# Patient Record
Sex: Male | Born: 2000 | Race: White | Hispanic: Yes | Marital: Single
Health system: Southern US, Community
[De-identification: ages and names within clinical notes are randomized; demographics above are authoritative.]

---

## 2020-09-16 ENCOUNTER — Emergency Department (HOSPITAL_COMMUNITY): Payer: Self-pay

## 2020-09-16 ENCOUNTER — Other Ambulatory Visit: Payer: Self-pay

## 2020-09-16 ENCOUNTER — Encounter (HOSPITAL_COMMUNITY): Payer: Self-pay | Admitting: Emergency Medicine

## 2020-09-16 ENCOUNTER — Emergency Department (HOSPITAL_COMMUNITY)
Admission: EM | Admit: 2020-09-16 | Discharge: 2020-09-16 | Disposition: A | Discharge status: Home / Self Care | Payer: Self-pay | Attending: Emergency Medicine | Admitting: Emergency Medicine

## 2020-09-16 DIAGNOSIS — R509 Fever, unspecified: Secondary | ICD-10-CM | POA: Insufficient documentation

## 2020-09-16 DIAGNOSIS — M5126 Other intervertebral disc displacement, lumbar region: Secondary | ICD-10-CM

## 2020-09-16 DIAGNOSIS — M5136 Other intervertebral disc degeneration, lumbar region: Secondary | ICD-10-CM

## 2020-09-16 DIAGNOSIS — M25559 Pain in unspecified hip: Secondary | ICD-10-CM | POA: Insufficient documentation

## 2020-09-16 DIAGNOSIS — M5416 Radiculopathy, lumbar region: Secondary | ICD-10-CM | POA: Insufficient documentation

## 2020-09-16 LAB — URINALYSIS, ROUTINE W REFLEX MICROSCOPIC
Bilirubin Urine: NEGATIVE
Glucose, UA: NEGATIVE mg/dL
Hgb urine dipstick: NEGATIVE
Ketones, ur: NEGATIVE mg/dL
Leukocytes,Ua: NEGATIVE
Nitrite: NEGATIVE
Protein, ur: NEGATIVE mg/dL
Specific Gravity, Urine: 1.025 (ref 1.005–1.030)
pH: 5 (ref 5.0–8.0)

## 2020-09-16 LAB — CBC WITH DIFFERENTIAL/PLATELET
Abs Immature Granulocytes: 0.01 10*3/uL (ref 0.00–0.07)
Basophils Absolute: 0 10*3/uL (ref 0.0–0.1)
Basophils Relative: 1 %
Eosinophils Absolute: 0 10*3/uL (ref 0.0–0.5)
Eosinophils Relative: 1 %
HCT: 48.1 % (ref 39.0–52.0)
Hemoglobin: 15.3 g/dL (ref 13.0–17.0)
Immature Granulocytes: 0 %
Lymphocytes Relative: 33 %
Lymphs Abs: 1.4 10*3/uL (ref 0.7–4.0)
MCH: 27.5 pg (ref 26.0–34.0)
MCHC: 31.8 g/dL (ref 30.0–36.0)
MCV: 86.4 fL (ref 80.0–100.0)
Monocytes Absolute: 0.8 10*3/uL (ref 0.1–1.0)
Monocytes Relative: 20 %
Neutro Abs: 1.9 10*3/uL (ref 1.7–7.7)
Neutrophils Relative %: 45 %
Platelets: 187 10*3/uL (ref 150–400)
RBC: 5.57 MIL/uL (ref 4.22–5.81)
RDW: 13.2 % (ref 11.5–15.5)
WBC: 4.2 10*3/uL (ref 4.0–10.5)
nRBC: 0 % (ref 0.0–0.2)

## 2020-09-16 LAB — COMPREHENSIVE METABOLIC PANEL
ALT: 40 U/L (ref 0–44)
AST: 35 U/L (ref 15–41)
Albumin: 4.5 g/dL (ref 3.5–5.0)
Alkaline Phosphatase: 100 U/L (ref 38–126)
Anion gap: 11 (ref 5–15)
BUN: 12 mg/dL (ref 6–20)
CO2: 25 mmol/L (ref 22–32)
Calcium: 9.3 mg/dL (ref 8.9–10.3)
Chloride: 99 mmol/L (ref 98–111)
Creatinine, Ser: 0.95 mg/dL (ref 0.61–1.24)
GFR, Estimated: >60 mL/min (ref >60)
Glucose, Bld: 110 mg/dL — ABNORMAL HIGH (ref 70–99)
Potassium: 3.5 mmol/L (ref 3.5–5.1)
Sodium: 135 mmol/L (ref 135–145)
Total Bilirubin: 0.6 mg/dL (ref 0.3–1.2)
Total Protein: 7.6 g/dL (ref 6.5–8.1)

## 2020-09-16 LAB — SEDIMENTATION RATE: Sed Rate: 7 mm/hr (ref 0–16)

## 2020-09-16 LAB — C-REACTIVE PROTEIN: CRP: 2.5 mg/dL — ABNORMAL HIGH (ref <1.0)

## 2020-09-16 IMAGING — MR MR THORACIC SPINE WO/W CM
5 of 11 series · 17 of 48 positions shown · IV contrast (gadavist)
Comparison: None.

CLINICAL DATA: Initial evaluation for epidural abscess suspected.
Acute right-sided lower back pain.

EXAM:
MRI THORACIC AND LUMBAR SPINE WITHOUT AND WITH CONTRAST
TECHNIQUE: Multiplanar and multiecho pulse sequences of the thoracic and lumbar
spine were obtained without and with intravenous contrast.
CONTRAST:  7.5mL GADAVIST GADOBUTROL 1 MMOL/ML IV SOLN

[Series 16: T1 · sagittal · 3.0mm · 0.62mm/px · 3 of 13 slices shown (1 of 3)]
[im 1/13]
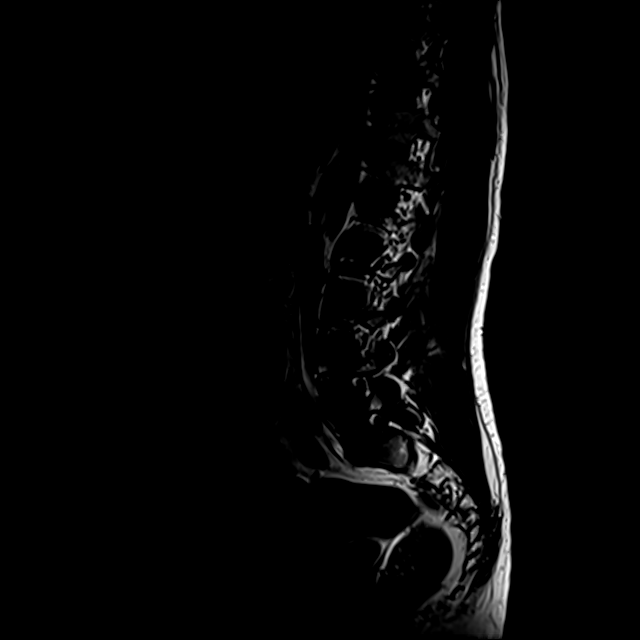
[im 7/13]
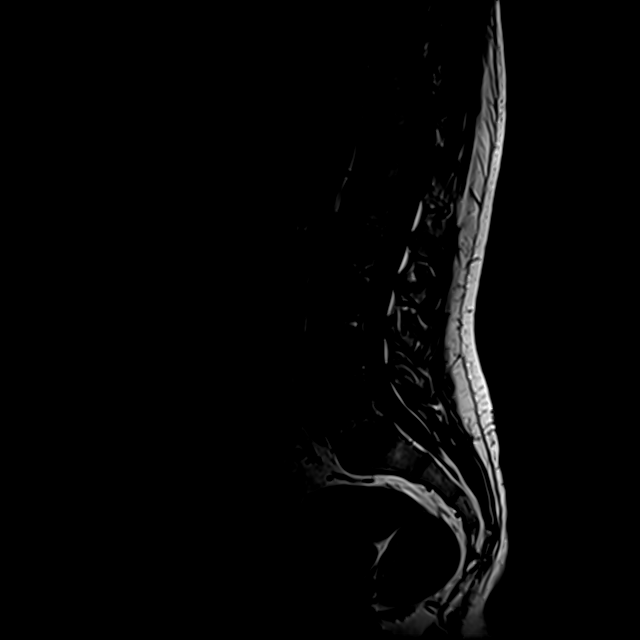
[im 13/13]
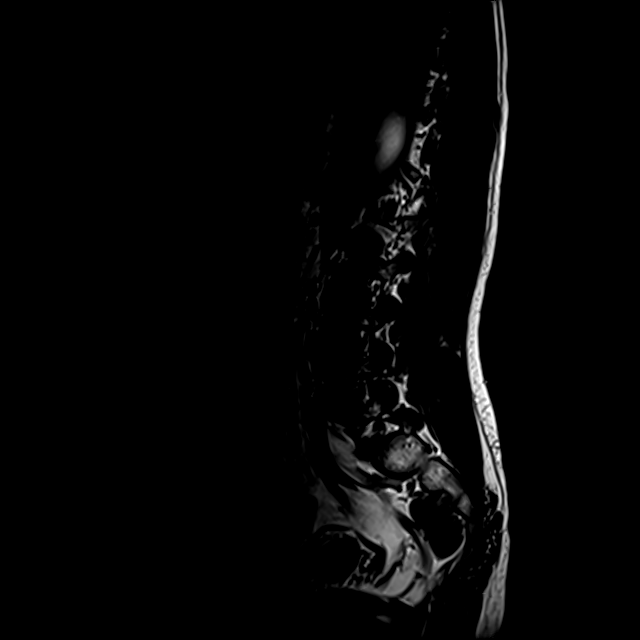

[Series 17: T1 · sagittal · 3.0mm · 0.62mm/px · 3 of 13 slices shown (2 of 3)]
[im 1/13]
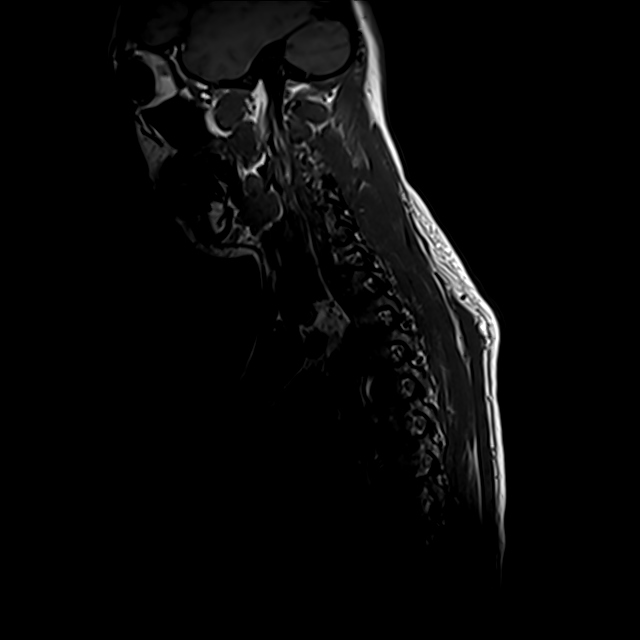
[im 7/13]
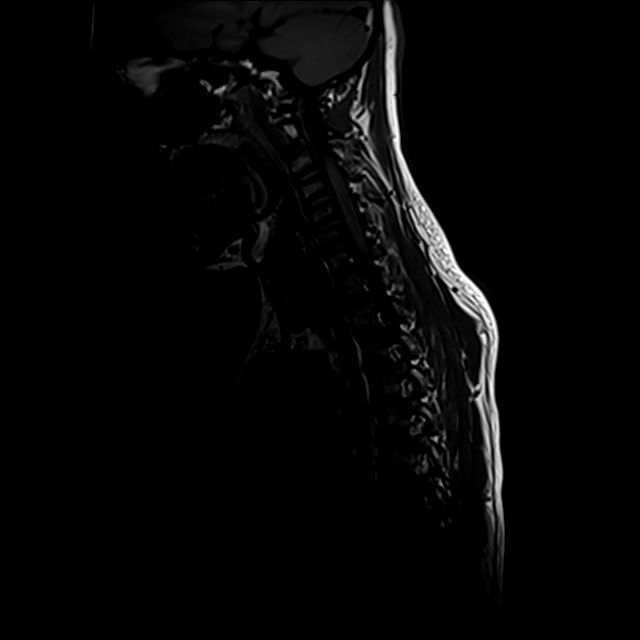
[im 13/13]
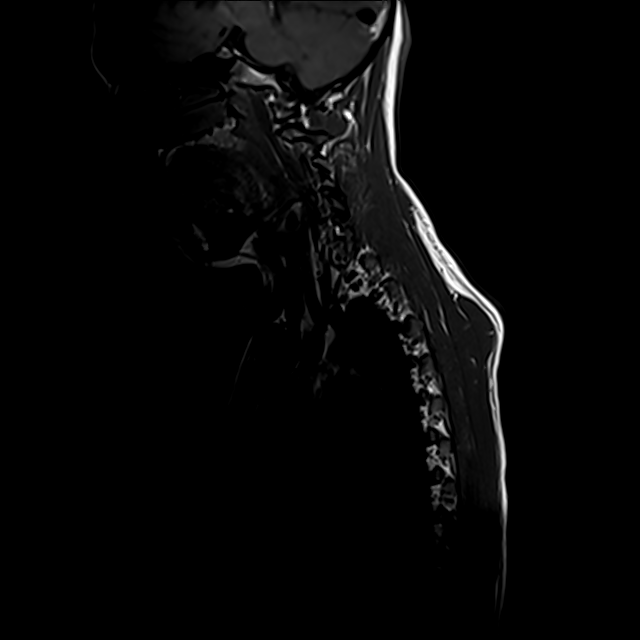

[Series 18: T1 · sagittal · 3.3mm · 0.62mm/px · 1 of 12 slices shown (3 of 3)]
[im 1/12]
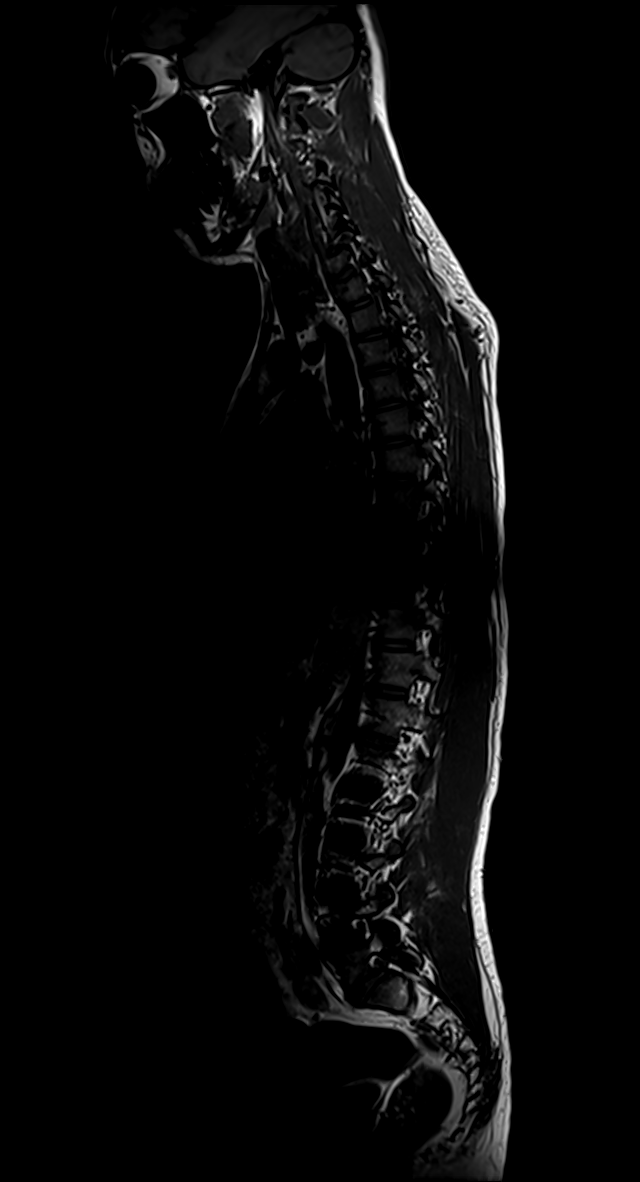

[Series 19: T2 · sagittal · 3.0mm · 0.67mm/px · 3 of 17 slices shown (1 of 2)]
[im 1/17]
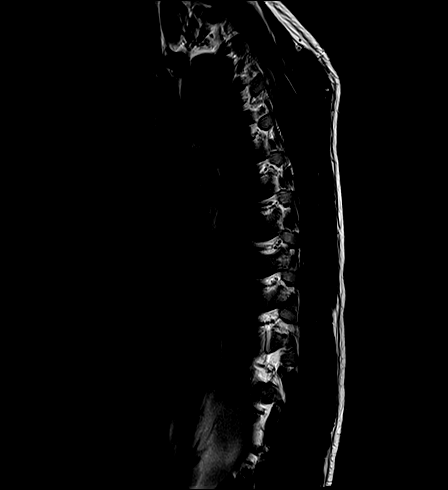
[im 9/17]
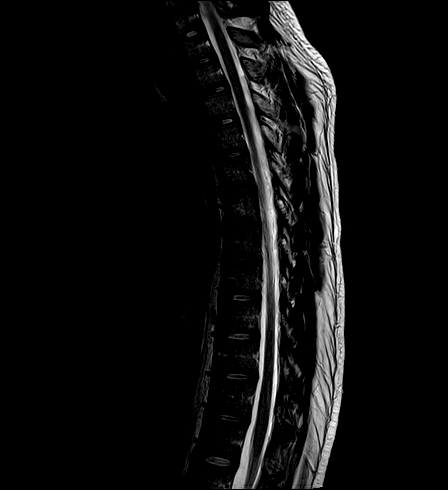
[im 17/17]
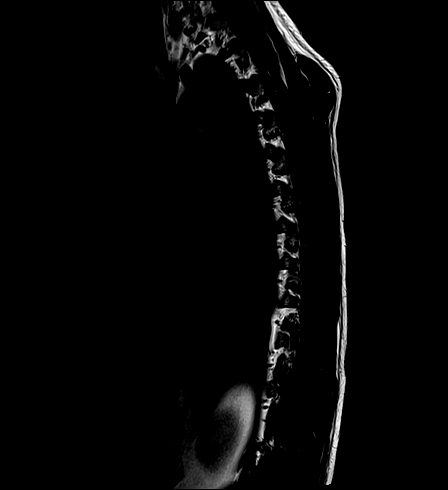

[Series 22: T2 · axial · 5.0mm · 0.59mm/px · z∈[-325,-41]mm · 7 of 39 slices shown (2 of 2)]
[im 1/39]
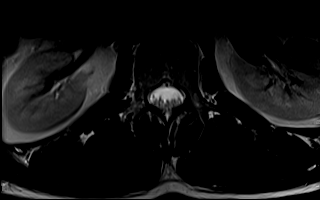
[im 7/39]
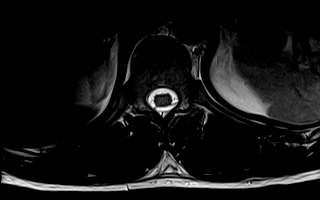
[im 13/39]
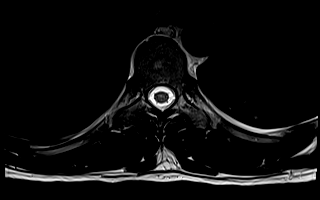
[im 20/39]
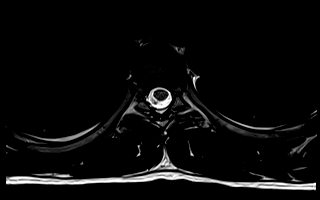
[im 26/39]
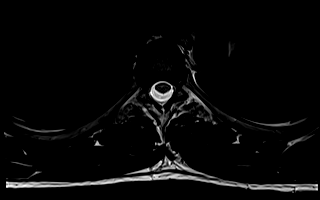
[im 32/39]
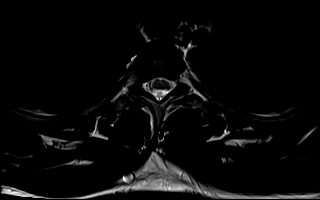
[im 39/39]
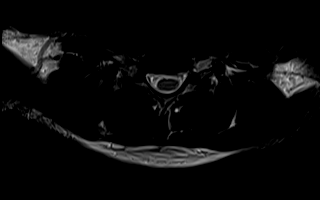

[17 of 48 positions shown; findings below may reference images not displayed]

FINDINGS: MRI THORACIC SPINE FINDINGS

Alignment:  Physiologic.  No listhesis.

Vertebrae: Vertebral body height maintained without acute or chronic
fracture. Bone marrow signal intensity normal. No discrete or
worrisome osseous lesions. No abnormal marrow edema or enhancement.
No evidence for osteomyelitis discitis or septic arthritis.

Cord: Normal signal and morphology. No epidural abscess or other
collection. No abnormal enhancement.

Paraspinal and other soft tissues: Unremarkable.

Disc levels:

No significant disc pathology seen within the thoracic spine.
Intervertebral disc space height maintained. No disc bulge or focal
disc herniation. No stenosis or neural impingement.

MRI LUMBAR SPINE FINDINGS

Segmentation:  Standard.

Alignment: Chronic bilateral pars defects at L5 with associated 4 mm
spondylolisthesis of L5 on S1. Alignment otherwise normal.

Vertebrae: Vertebral body height maintained without acute or chronic
fracture. Bone marrow signal intensity within normal limits. No
discrete or worrisome osseous lesions. No abnormal marrow edema or
enhancement. No evidence for osteomyelitis discitis or septic
arthritis. Visualized SI joints within normal limits.

Conus medullaris: Extends to the L1 level and appears normal. No
epidural abscess or other collection.

Paraspinal and other soft tissues: Unremarkable.

Disc levels:

No significant findings are seen through the L3-4 level.

L4-5: Mild disc bulge. Tiny central disc protrusion mildly indents
the ventral epidural fat. No spinal stenosis or neural impingement.
Mild bilateral L4 foraminal stenosis.

L5-S1: Chronic bilateral pars defects with associated 4 mm
spondylolisthesis. Associated broad posterior pseudo disc
bulge/uncovering. Mild facet hypertrophy. No canal or lateral recess
stenosis. Mild bilateral L5 foraminal narrowing.
IMPRESSION: 1. No evidence for osteomyelitis discitis or septic arthritis within
the thoracic or lumbar spine. No epidural abscess or other
collection.
2. Chronic bilateral pars defects at L5 with associated 4 mm
spondylolisthesis. Associated mild bilateral L5 foraminal stenosis.
3. Mild disc bulge with tiny central disc protrusion at L4-5 without
significant stenosis or impingement.

## 2020-09-16 IMAGING — DX DG HIP (WITH OR WITHOUT PELVIS) 2-3V*R*
3 series · 3 of 3 positions shown · non-contrast
Comparison: None

CLINICAL DATA: RIGHT hip pain

EXAM:
DG HIP (WITH OR WITHOUT PELVIS) 2-3V RIGHT

[pelvis ap]
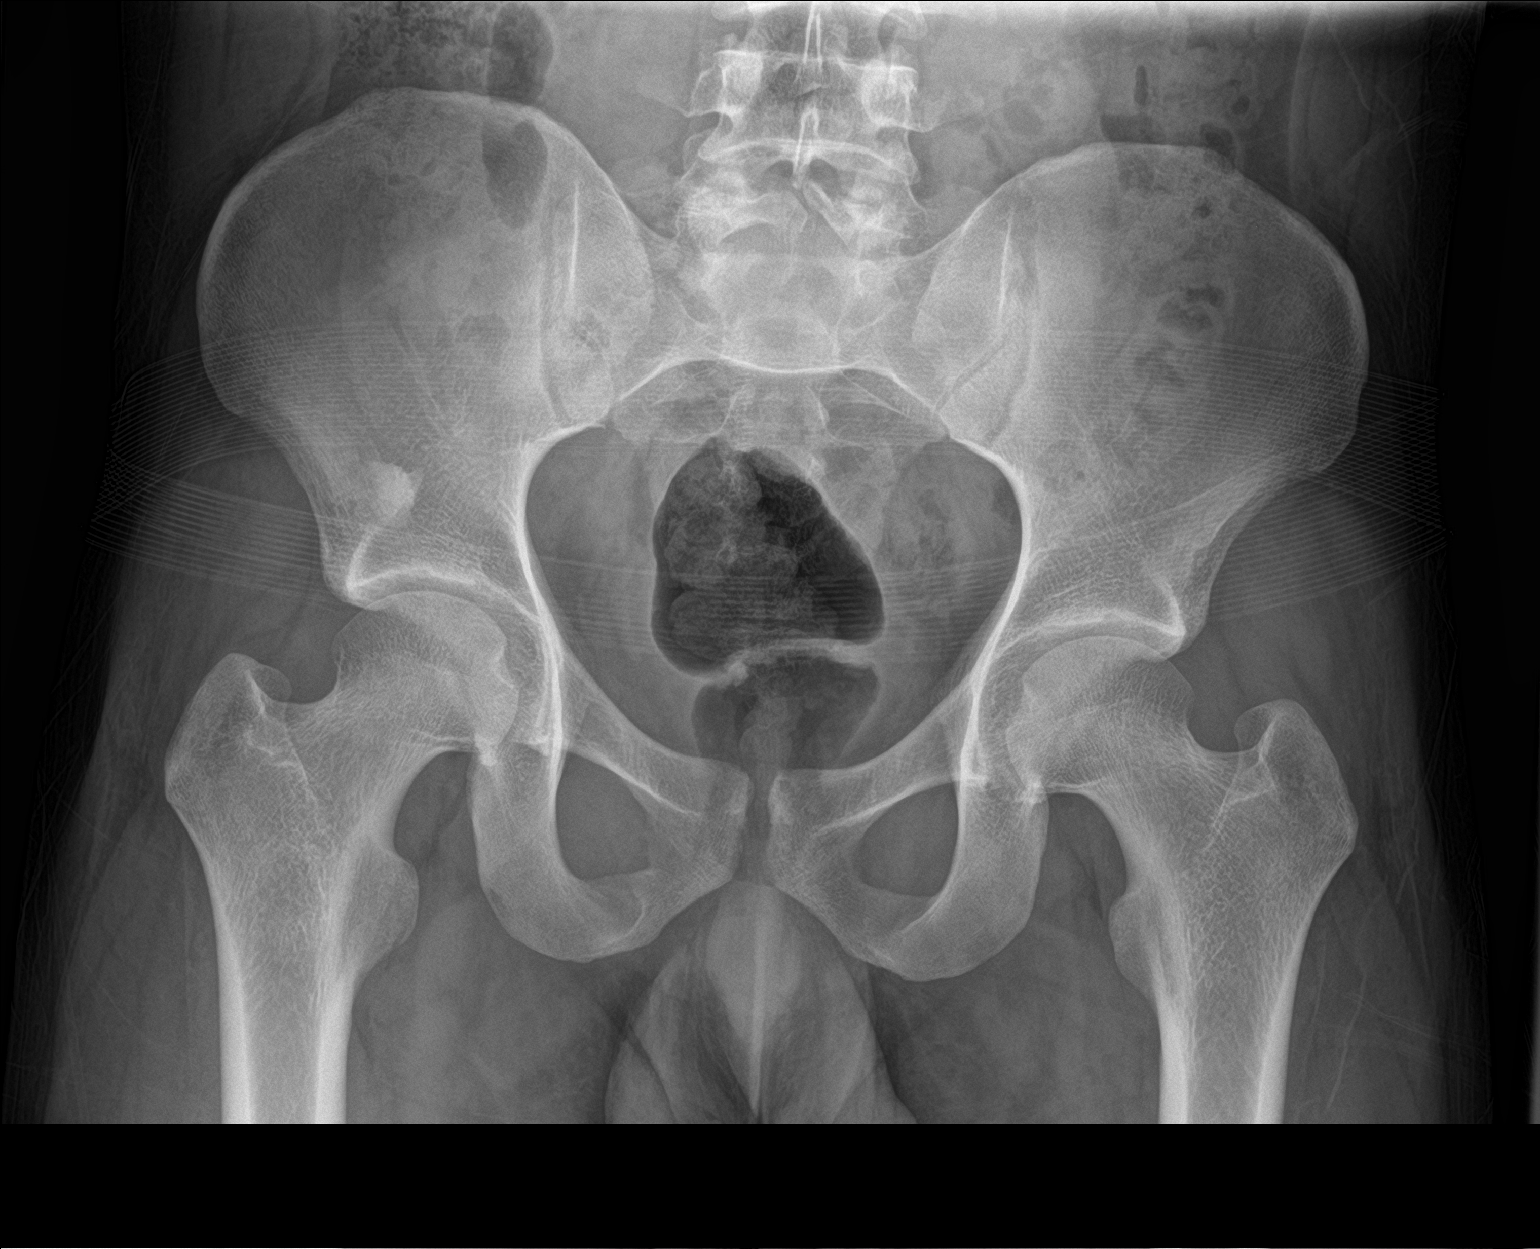

[hip ap]
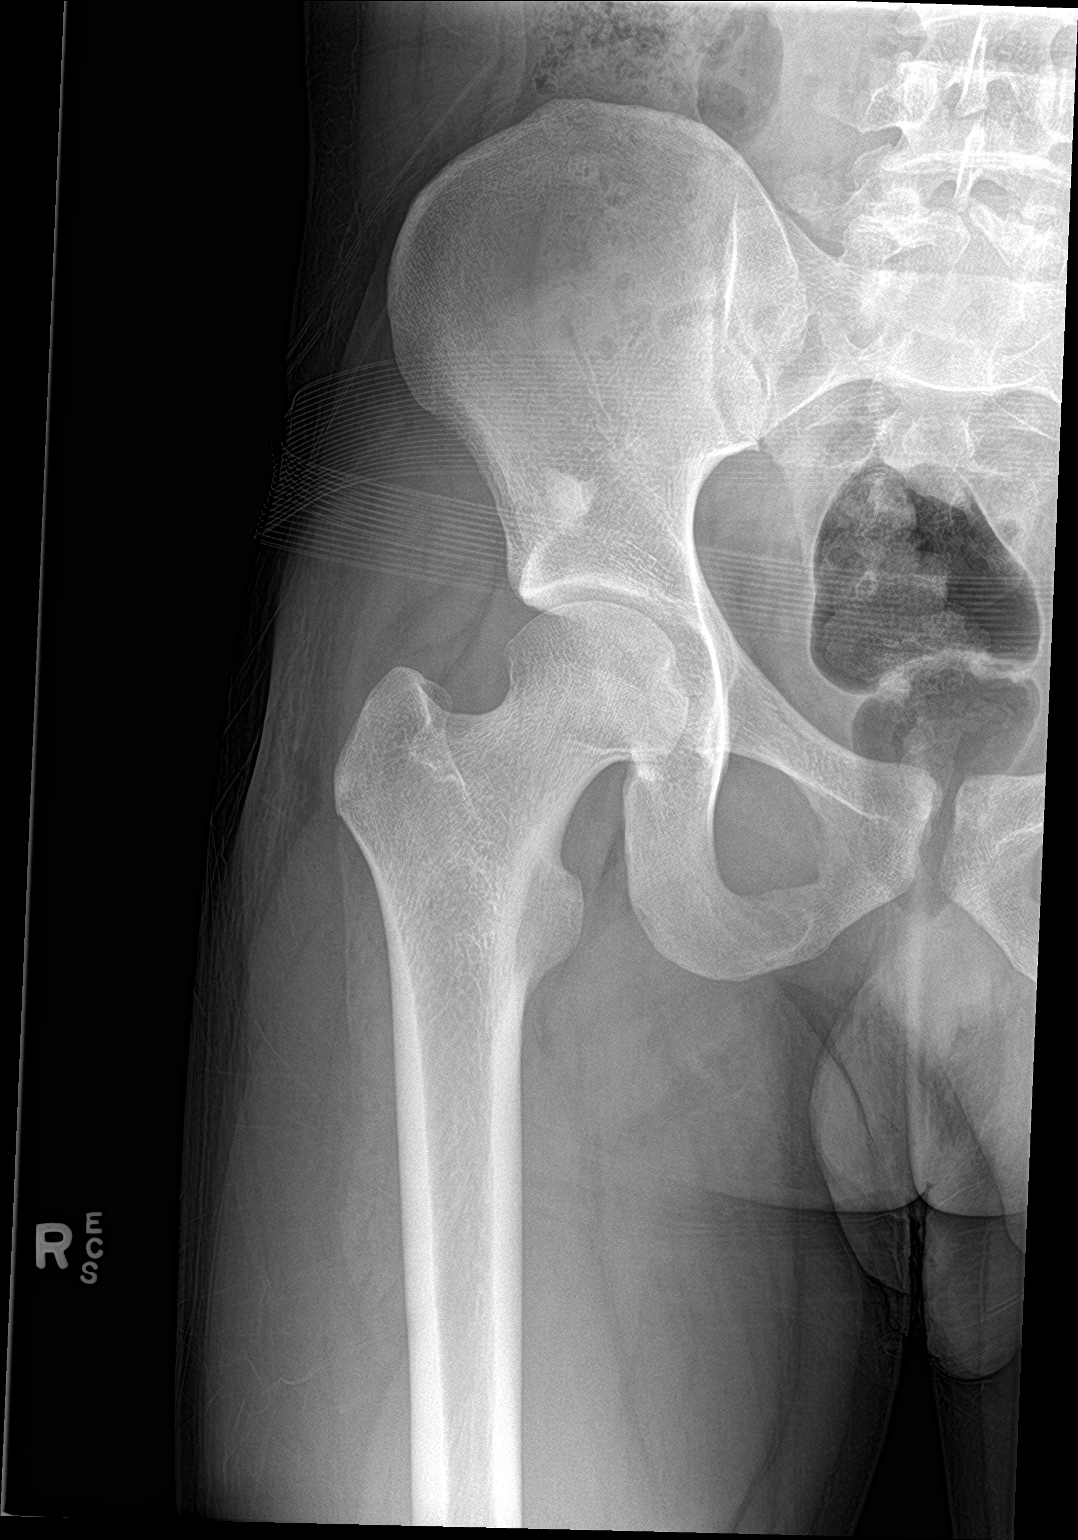

[hip lat]
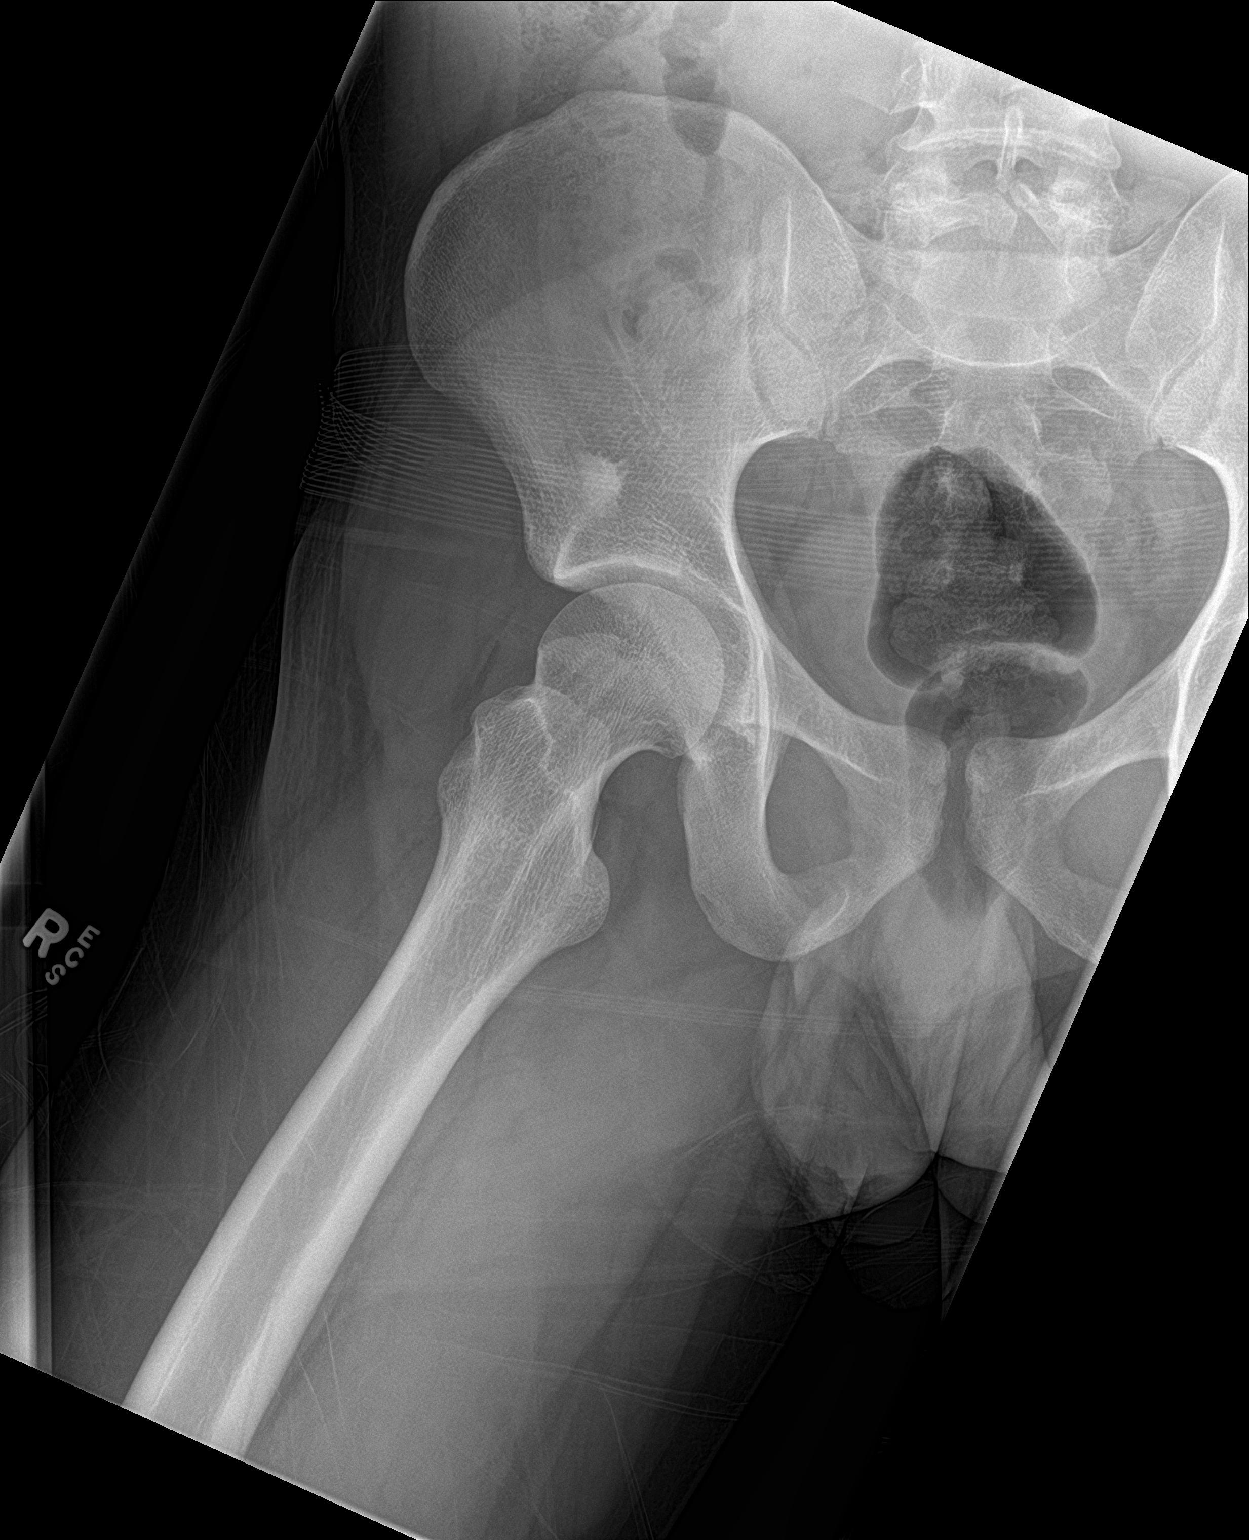

[3 of 3 positions shown; findings below may reference images not displayed]

FINDINGS: There is no evidence of hip fracture or dislocation. There is no
evidence of arthropathy or other focal bone abnormality. Question
mild subcutaneous fat stranding slightly asymmetric to the LEFT
though this is un clear.
IMPRESSION: 1. No acute osseous abnormality.
2. Question mild subcutaneous fat stranding, slightly asymmetric to
the LEFT.

## 2020-09-16 IMAGING — MR MR LUMBAR SPINE WO/W CM
6 of 9 series · 29 of 48 positions shown · IV contrast (gadavist)
Comparison: None.

CLINICAL DATA: Initial evaluation for epidural abscess suspected.
Acute right-sided lower back pain.

EXAM:
MRI THORACIC AND LUMBAR SPINE WITHOUT AND WITH CONTRAST
TECHNIQUE: Multiplanar and multiecho pulse sequences of the thoracic and lumbar
spine were obtained without and with intravenous contrast.
CONTRAST:  7.5mL GADAVIST GADOBUTROL 1 MMOL/ML IV SOLN

[Series 1: T2 · sagittal · 4.0mm · 0.62mm/px · 4 of 16 slices shown (1 of 2)]
[im 1/16]
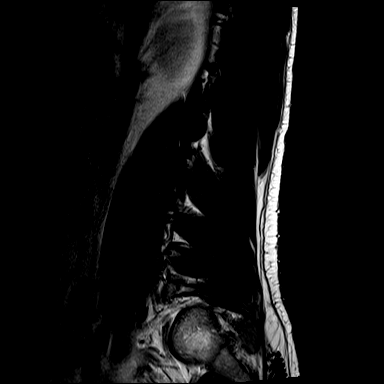
[im 6/16]
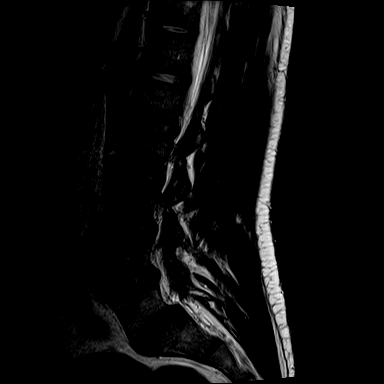
[im 11/16]
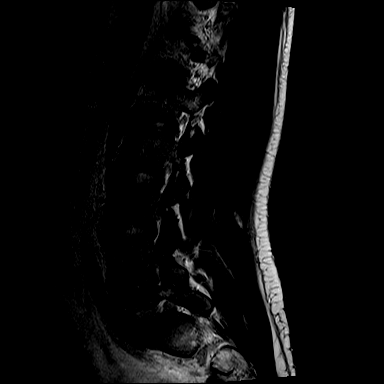
[im 16/16]
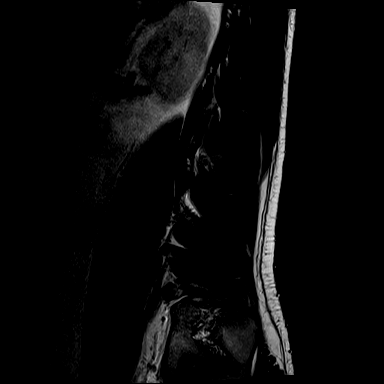

[Series 3: T1 · sagittal · 4.0mm · 0.75mm/px · 3 of 16 slices shown (1 of 2)]
[im 1/16]
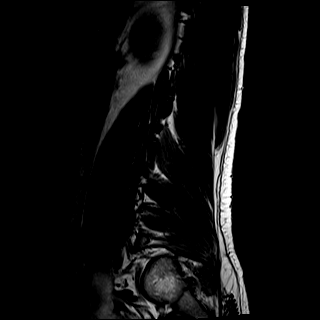
[im 8/16]
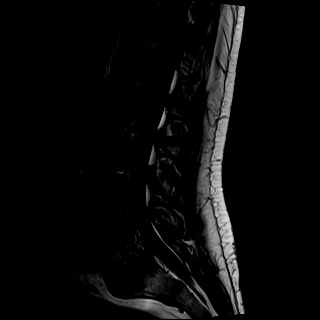
[im 16/16]
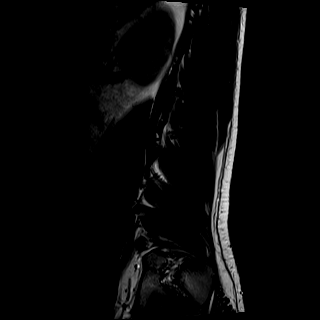

[Series 4: T2 · axial · 4.0mm · 0.57mm/px · z∈[-516,-312]mm · 7 of 38 slices shown (2 of 2)]
[im 1/38]
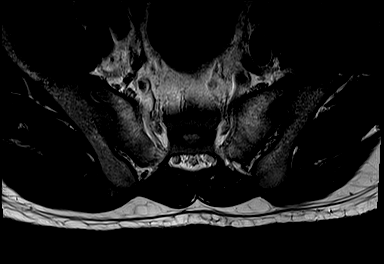
[im 7/38]
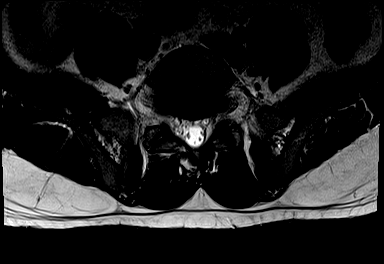
[im 13/38]
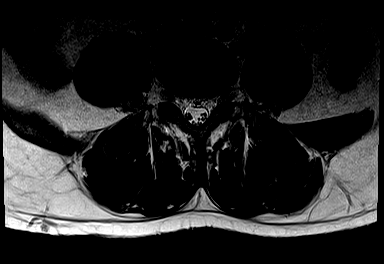
[im 19/38]
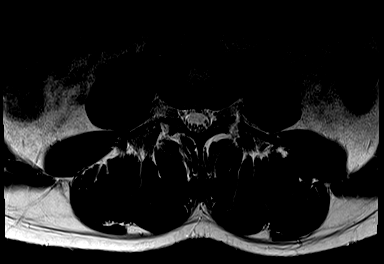
[im 25/38]
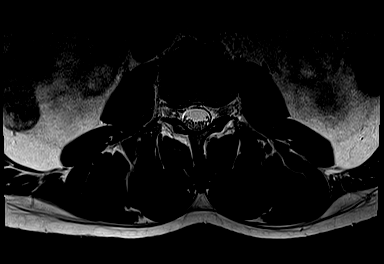
[im 31/38]
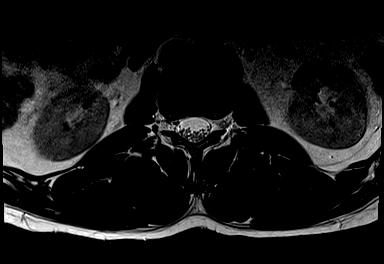
[im 38/38]
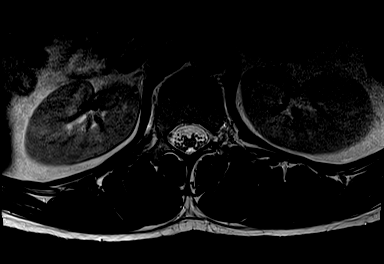

[Series 6: T1 · axial · 4.0mm · 0.34mm/px · z∈[-516,-312]mm · 7 of 38 slices shown (2 of 2)]
[im 1/38]
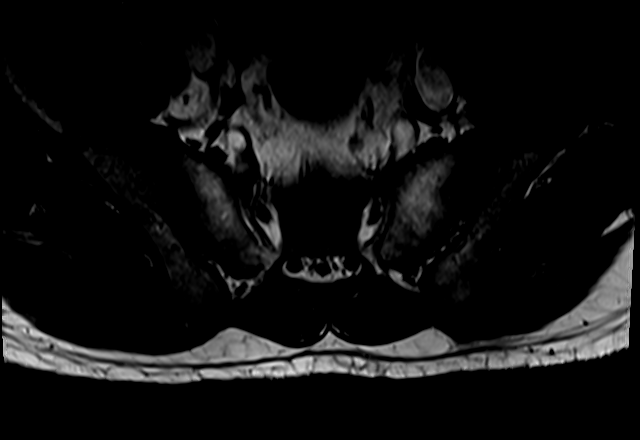
[im 7/38]
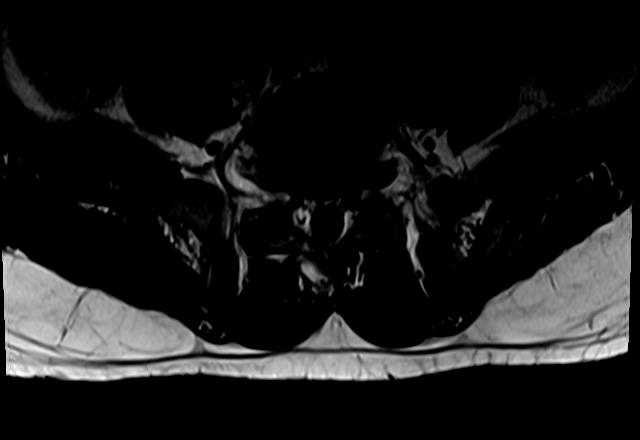
[im 13/38]
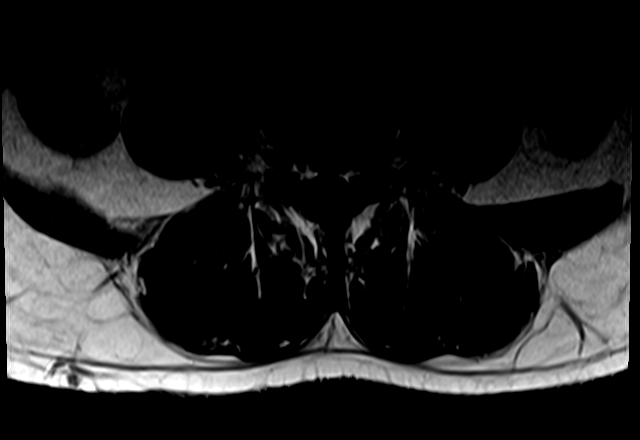
[im 19/38]
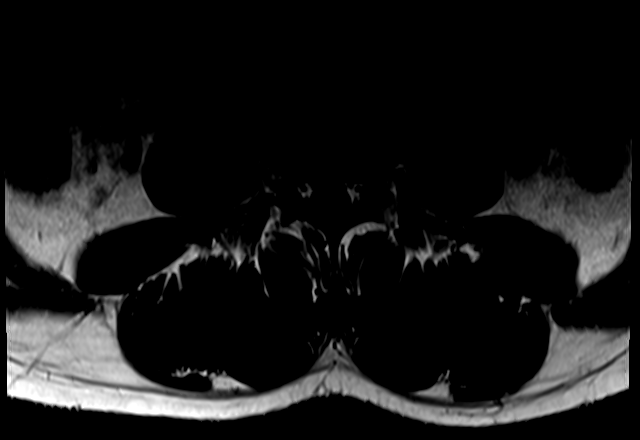
[im 25/38]
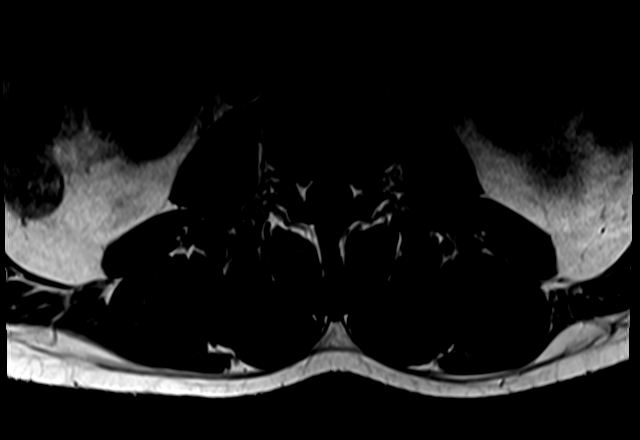
[im 31/38]
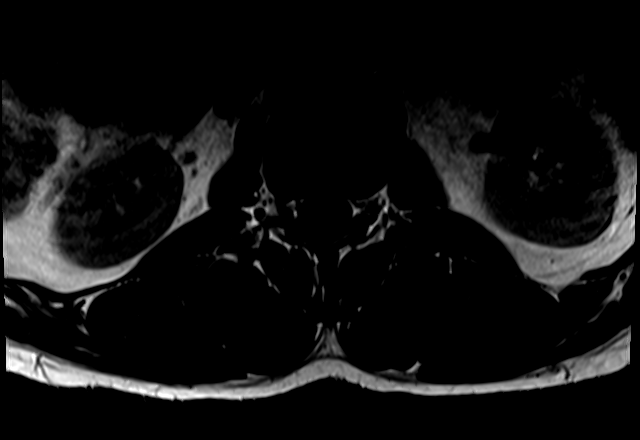
[im 38/38]
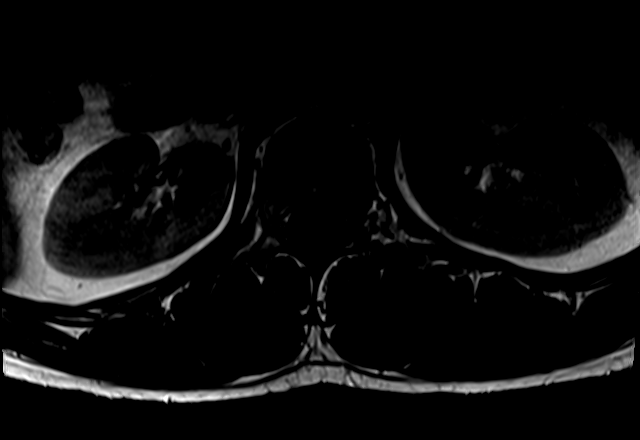

[Series 23: T1 fat-sat post-contrast · sagittal · 4.0mm · 0.75mm/px · 3 of 16 slices shown (1 of 2)]
[im 1/16]
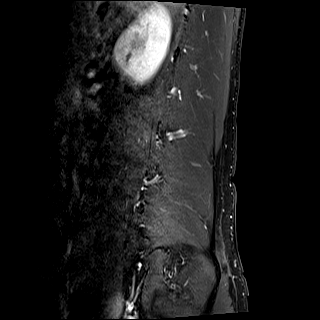
[im 8/16]
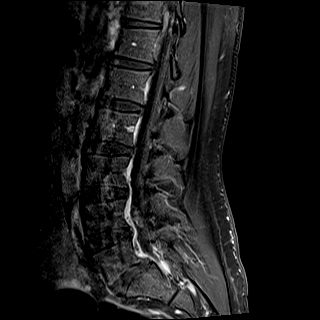
[im 16/16]
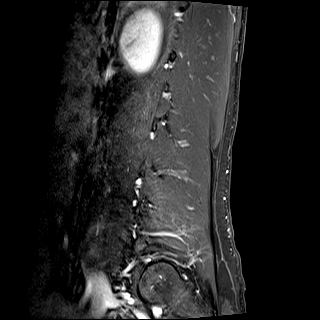

[Series 25: T1 fat-sat post-contrast · axial · 4.0mm · 0.43mm/px · z∈[-506,-365]mm · 5 of 38 slices shown (2 of 2)]
[im 1/38]
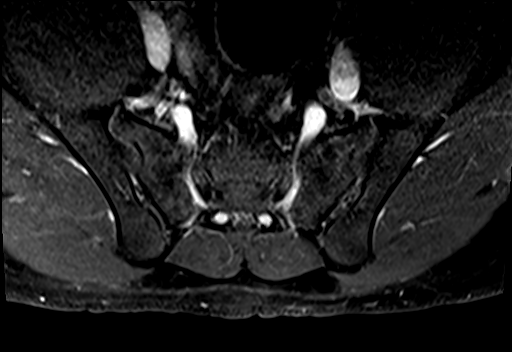
[im 7/38]
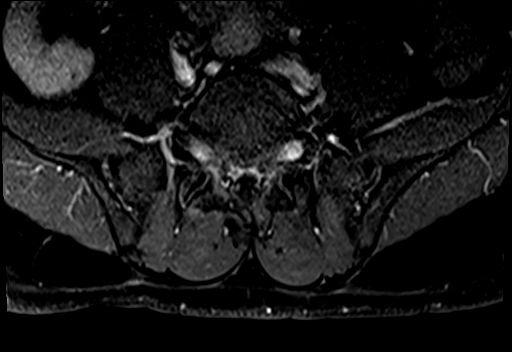
[im 13/38]
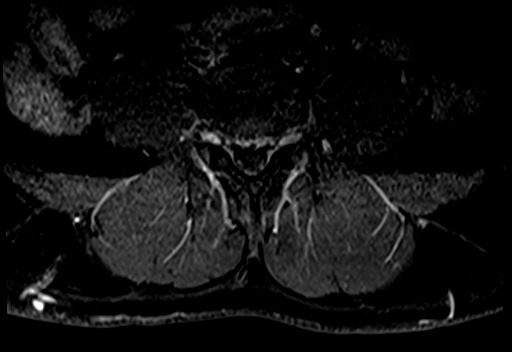
[im 19/38]
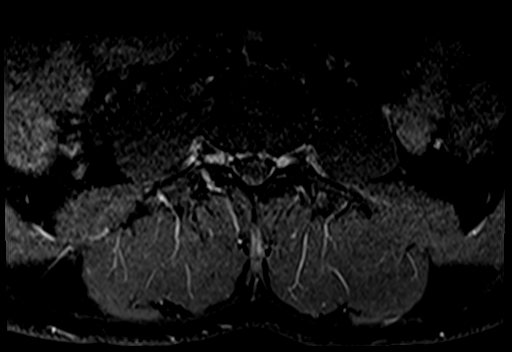
[im 25/38]
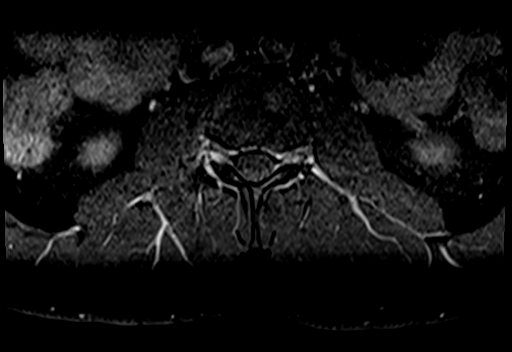

[29 of 48 positions shown; findings below may reference images not displayed]

FINDINGS: MRI THORACIC SPINE FINDINGS

Alignment:  Physiologic.  No listhesis.

Vertebrae: Vertebral body height maintained without acute or chronic
fracture. Bone marrow signal intensity normal. No discrete or
worrisome osseous lesions. No abnormal marrow edema or enhancement.
No evidence for osteomyelitis discitis or septic arthritis.

Cord: Normal signal and morphology. No epidural abscess or other
collection. No abnormal enhancement.

Paraspinal and other soft tissues: Unremarkable.

Disc levels:

No significant disc pathology seen within the thoracic spine.
Intervertebral disc space height maintained. No disc bulge or focal
disc herniation. No stenosis or neural impingement.

MRI LUMBAR SPINE FINDINGS

Segmentation:  Standard.

Alignment: Chronic bilateral pars defects at L5 with associated 4 mm
spondylolisthesis of L5 on S1. Alignment otherwise normal.

Vertebrae: Vertebral body height maintained without acute or chronic
fracture. Bone marrow signal intensity within normal limits. No
discrete or worrisome osseous lesions. No abnormal marrow edema or
enhancement. No evidence for osteomyelitis discitis or septic
arthritis. Visualized SI joints within normal limits.

Conus medullaris: Extends to the L1 level and appears normal. No
epidural abscess or other collection.

Paraspinal and other soft tissues: Unremarkable.

Disc levels:

No significant findings are seen through the L3-4 level.

L4-5: Mild disc bulge. Tiny central disc protrusion mildly indents
the ventral epidural fat. No spinal stenosis or neural impingement.
Mild bilateral L4 foraminal stenosis.

L5-S1: Chronic bilateral pars defects with associated 4 mm
spondylolisthesis. Associated broad posterior pseudo disc
bulge/uncovering. Mild facet hypertrophy. No canal or lateral recess
stenosis. Mild bilateral L5 foraminal narrowing.
IMPRESSION: 1. No evidence for osteomyelitis discitis or septic arthritis within
the thoracic or lumbar spine. No epidural abscess or other
collection.
2. Chronic bilateral pars defects at L5 with associated 4 mm
spondylolisthesis. Associated mild bilateral L5 foraminal stenosis.
3. Mild disc bulge with tiny central disc protrusion at L4-5 without
significant stenosis or impingement.

## 2020-09-16 MED ORDER — CYCLOBENZAPRINE HCL 10 MG PO TABS: 10.0000 mg | ORAL_TABLET | Freq: Two times a day (BID) | ORAL | 0 refills | Status: AC | PRN

## 2020-09-16 MED ORDER — IBUPROFEN 600 MG PO TABS: 600.0000 mg | ORAL_TABLET | Freq: Four times a day (QID) | ORAL | 0 refills | Status: AC | PRN

## 2020-09-16 MED ORDER — ACETAMINOPHEN 500 MG PO TABS
1000.0000 mg | ORAL_TABLET | Freq: Once | ORAL | Status: AC
  Administered 2020-09-16: 1000 mg via ORAL
  Filled 2020-09-16: qty 2

## 2020-09-16 MED ORDER — GADOBUTROL 1 MMOL/ML IV SOLN
7.5000 mL | Freq: Once | INTRAVENOUS | Status: AC | PRN
  Administered 2020-09-16: 7.5 mL via INTRAVENOUS

## 2020-09-16 NOTE — ED Triage Notes (Signed)
Pt coming from home. Pt complaint of hip pain and fever that began yesterday. VSS. NAD.

## 2020-09-16 NOTE — Progress Notes (Signed)
Pt vomited immediately following MRI contrast (Gadavist). Nausea and vomiting passed in 25 to 30 seconds, pt returned back to baseline and finished study.

## 2020-09-16 NOTE — Discharge Instructions (Signed)
Puede tomar 600 mg de ibuprofeno cada 6 horas segn sea necesario para el dolor. Aplique hielo en su espalda durante 20 minutos a la vez. Tambin puede aplicar calor si esto le proporciona ms alivio. Puede tomar Flexeril / ciclobenzaprina cada 12 horas segn sea necesario para el espasmo muscular. Tenga en cuenta que este medicamento puede causarle somnolencia; no lo tome mientras conduce o bebe alcohol. Establezca la atencin con un proveedor de [employer] scientist. Puede programar una cita con el ortopedista segn sea necesario si los sntomas persisten. Regrese a la sala de emergencias si tiene un nuevo entumecimiento o debilidad en las piernas, incapacidad para orinar, incapacidad para contener los intestinos o debilidad en las extremidades, fiebre persistente.

## 2020-09-16 NOTE — ED Provider Notes (Signed)
Care assumed at shift change from Dr. Adela Lank, pending MRI imaging of the spine and blood work. See their note for full HPI and workup. Briefly, pt presenting with 1 day of fever, right sided-low back pain radiating to right hip/groin. No urinary sx or abdominal complaints. No medical problems. Labs and MRI ordered - rule out epidural abscess. If neg likely musculoskeletal with FUO. Consider urinary tract cause if UA with abnl results.   Physical Exam  BP 106/74 (BP Location: Left Arm)   Pulse 95   Temp 99.2 F (37.3 C) (Oral)   Resp 17   Ht 5\' 6"  (1.676 m)   Wt 77.1 kg   SpO2 98%   BMI 27.44 kg/m   Physical Exam Vitals and nursing note reviewed.  Constitutional:      General: He is not in acute distress.    Appearance: He is well-developed.  HENT:     Head: Normocephalic and atraumatic.  Eyes:     Conjunctiva/sclera: Conjunctivae normal.  Cardiovascular:     Rate and Rhythm: Normal rate.  Pulmonary:     Effort: Pulmonary effort is normal.  Abdominal:     Palpations: Abdomen is soft.  Skin:    General: Skin is warm.  Neurological:     Mental Status: He is alert.  Psychiatric:        Behavior: Behavior normal.     ED Course/Procedures    Results for orders placed or performed during the hospital encounter of 09/16/20  CBC with Differential  Result Value Ref Range   WBC 4.2 4.0 - 10.5 K/uL   RBC 5.57 4.22 - 5.81 MIL/uL   Hemoglobin 15.3 13.0 - 17.0 g/dL   HCT 13/07/21 39 - 52 %   MCV 86.4 80.0 - 100.0 fL   MCH 27.5 26.0 - 34.0 pg   MCHC 31.8 30.0 - 36.0 g/dL   RDW 34.7 42.5 - 95.6 %   Platelets 187 150 - 400 K/uL   nRBC 0.0 0.0 - 0.2 %   Neutrophils Relative % 45 %   Neutro Abs 1.9 1.7 - 7.7 K/uL   Lymphocytes Relative 33 %   Lymphs Abs 1.4 0.7 - 4.0 K/uL   Monocytes Relative 20 %   Monocytes Absolute 0.8 0.1 - 1.0 K/uL   Eosinophils Relative 1 %   Eosinophils Absolute 0.0 0.0 - 0.5 K/uL   Basophils Relative 1 %   Basophils Absolute 0.0 0.0 - 0.1 K/uL   Immature  Granulocytes 0 %   Abs Immature Granulocytes 0.01 0.00 - 0.07 K/uL  Comprehensive metabolic panel  Result Value Ref Range   Sodium 135 135 - 145 mmol/L   Potassium 3.5 3.5 - 5.1 mmol/L   Chloride 99 98 - 111 mmol/L   CO2 25 22 - 32 mmol/L   Glucose, Bld 110 (H) 70 - 99 mg/dL   BUN 12 6 - 20 mg/dL   Creatinine, Ser 38.7 0.61 - 1.24 mg/dL   Calcium 9.3 8.9 - 5.64 mg/dL   Total Protein 7.6 6.5 - 8.1 g/dL   Albumin 4.5 3.5 - 5.0 g/dL   AST 35 15 - 41 U/L   ALT 40 0 - 44 U/L   Alkaline Phosphatase 100 38 - 126 U/L   Total Bilirubin 0.6 0.3 - 1.2 mg/dL   GFR, Estimated 33.2 >95 mL/min   Anion gap 11 5 - 15  Sedimentation rate  Result Value Ref Range   Sed Rate 7 0 - 16 mm/hr  C-reactive protein  Result Value Ref Range   CRP 2.5 (H) <1.0 mg/dL  Urinalysis, Routine w reflex microscopic  Result Value Ref Range   Color, Urine YELLOW YELLOW   APPearance CLEAR CLEAR   Specific Gravity, Urine 1.025 1.005 - 1.030   pH 5.0 5.0 - 8.0   Glucose, UA NEGATIVE NEGATIVE mg/dL   Hgb urine dipstick NEGATIVE NEGATIVE   Bilirubin Urine NEGATIVE NEGATIVE   Ketones, ur NEGATIVE NEGATIVE mg/dL   Protein, ur NEGATIVE NEGATIVE mg/dL   Nitrite NEGATIVE NEGATIVE   Leukocytes,Ua NEGATIVE NEGATIVE   MR THORACIC SPINE W WO CONTRAST  Result Date: 09/16/2020 CLINICAL DATA:  Initial evaluation for epidural abscess suspected. Acute right-sided lower back pain. EXAM: MRI THORACIC AND LUMBAR SPINE WITHOUT AND WITH CONTRAST TECHNIQUE: Multiplanar and multiecho pulse sequences of the thoracic and lumbar spine were obtained without and with intravenous contrast. CONTRAST:  7.12mL GADAVIST GADOBUTROL 1 MMOL/ML IV SOLN COMPARISON:  None. FINDINGS: MRI THORACIC SPINE FINDINGS Alignment:  Physiologic.  No listhesis. Vertebrae: Vertebral body height maintained without acute or chronic fracture. Bone marrow signal intensity normal. No discrete or worrisome osseous lesions. No abnormal marrow edema or enhancement. No evidence  for osteomyelitis discitis or septic arthritis. Cord: Normal signal and morphology. No epidural abscess or other collection. No abnormal enhancement. Paraspinal and other soft tissues: Unremarkable. Disc levels: No significant disc pathology seen within the thoracic spine. Intervertebral disc space height maintained. No disc bulge or focal disc herniation. No stenosis or neural impingement. MRI LUMBAR SPINE FINDINGS Segmentation:  Standard. Alignment: Chronic bilateral pars defects at L5 with associated 4 mm spondylolisthesis of L5 on S1. Alignment otherwise normal. Vertebrae: Vertebral body height maintained without acute or chronic fracture. Bone marrow signal intensity within normal limits. No discrete or worrisome osseous lesions. No abnormal marrow edema or enhancement. No evidence for osteomyelitis discitis or septic arthritis. Visualized SI joints within normal limits. Conus medullaris: Extends to the L1 level and appears normal. No epidural abscess or other collection. Paraspinal and other soft tissues: Unremarkable. Disc levels: No significant findings are seen through the L3-4 level. L4-5: Mild disc bulge. Tiny central disc protrusion mildly indents the ventral epidural fat. No spinal stenosis or neural impingement. Mild bilateral L4 foraminal stenosis. L5-S1: Chronic bilateral pars defects with associated 4 mm spondylolisthesis. Associated broad posterior pseudo disc bulge/uncovering. Mild facet hypertrophy. No canal or lateral recess stenosis. Mild bilateral L5 foraminal narrowing. IMPRESSION: 1. No evidence for osteomyelitis discitis or septic arthritis within the thoracic or lumbar spine. No epidural abscess or other collection. 2. Chronic bilateral pars defects at L5 with associated 4 mm spondylolisthesis. Associated mild bilateral L5 foraminal stenosis. 3. Mild disc bulge with tiny central disc protrusion at L4-5 without significant stenosis or impingement. Electronically Signed   By: Rise Mu M.D.   On: 09/16/2020 23:19   MR Lumbar Spine W Wo Contrast  Result Date: 09/16/2020 CLINICAL DATA:  Initial evaluation for epidural abscess suspected. Acute right-sided lower back pain. EXAM: MRI THORACIC AND LUMBAR SPINE WITHOUT AND WITH CONTRAST TECHNIQUE: Multiplanar and multiecho pulse sequences of the thoracic and lumbar spine were obtained without and with intravenous contrast. CONTRAST:  7.46mL GADAVIST GADOBUTROL 1 MMOL/ML IV SOLN COMPARISON:  None. FINDINGS: MRI THORACIC SPINE FINDINGS Alignment:  Physiologic.  No listhesis. Vertebrae: Vertebral body height maintained without acute or chronic fracture. Bone marrow signal intensity normal. No discrete or worrisome osseous lesions. No abnormal marrow edema or enhancement. No evidence for osteomyelitis discitis or septic arthritis. Cord: Normal signal and  morphology. No epidural abscess or other collection. No abnormal enhancement. Paraspinal and other soft tissues: Unremarkable. Disc levels: No significant disc pathology seen within the thoracic spine. Intervertebral disc space height maintained. No disc bulge or focal disc herniation. No stenosis or neural impingement. MRI LUMBAR SPINE FINDINGS Segmentation:  Standard. Alignment: Chronic bilateral pars defects at L5 with associated 4 mm spondylolisthesis of L5 on S1. Alignment otherwise normal. Vertebrae: Vertebral body height maintained without acute or chronic fracture. Bone marrow signal intensity within normal limits. No discrete or worrisome osseous lesions. No abnormal marrow edema or enhancement. No evidence for osteomyelitis discitis or septic arthritis. Visualized SI joints within normal limits. Conus medullaris: Extends to the L1 level and appears normal. No epidural abscess or other collection. Paraspinal and other soft tissues: Unremarkable. Disc levels: No significant findings are seen through the L3-4 level. L4-5: Mild disc bulge. Tiny central disc protrusion mildly indents the  ventral epidural fat. No spinal stenosis or neural impingement. Mild bilateral L4 foraminal stenosis. L5-S1: Chronic bilateral pars defects with associated 4 mm spondylolisthesis. Associated broad posterior pseudo disc bulge/uncovering. Mild facet hypertrophy. No canal or lateral recess stenosis. Mild bilateral L5 foraminal narrowing. IMPRESSION: 1. No evidence for osteomyelitis discitis or septic arthritis within the thoracic or lumbar spine. No epidural abscess or other collection. 2. Chronic bilateral pars defects at L5 with associated 4 mm spondylolisthesis. Associated mild bilateral L5 foraminal stenosis. 3. Mild disc bulge with tiny central disc protrusion at L4-5 without significant stenosis or impingement. Electronically Signed   By: Rise Mu M.D.   On: 09/16/2020 23:19   DG Hip Unilat  With Pelvis 2-3 Views Right  Result Date: 09/16/2020 CLINICAL DATA:  RIGHT hip pain EXAM: DG HIP (WITH OR WITHOUT PELVIS) 2-3V RIGHT COMPARISON:  None FINDINGS: There is no evidence of hip fracture or dislocation. There is no evidence of arthropathy or other focal bone abnormality. Question mild subcutaneous fat stranding slightly asymmetric to the LEFT though this is un clear. IMPRESSION: 1. No acute osseous abnormality. 2. Question mild subcutaneous fat stranding, slightly asymmetric to the LEFT. Electronically Signed   By: Donzetta Kohut M.D.   On: 09/16/2020 18:00    Procedures  MDM   Labs are reassuring. Neg U/A. No leukocytosis.  MRI with small L4-5 disc bulge. No epidural abscess or other collection. Suspect this is attributing to patient's pain. Discussed symptomatic management and provided outpatient referrals for follow up. Prescriptions for ibuprofen and flexeril provided.      Tieler Cournoyer, [place] N, PA-C 09/16/20 [id number]    Melene Plan, DO 09/17/20 [id number]

## 2020-09-16 NOTE — ED Provider Notes (Signed)
[facility] EMERGENCY DEPARTMENT Provider Note   CSN: [id number] Arrival date & time: 09/16/20  1630     History Chief Complaint  Patient presents with  . Hip Pain    Kenneth Lutz is a 19 y.o. male.  19 yo M with a cc of R low back pain to radiates to the groin.  Going since yesterday.  Has a history of right-sided low back pain in the past.  Feels similar but been sometime since his last time of pain.  He denies trauma.  Also noted that he has been having fevers and chills.  He denies any other significant symptoms.  The history is provided by the patient.  Hip Pain This is a new problem. The current episode started yesterday. The problem occurs constantly. The problem has not changed since onset.Pertinent negatives include no chest pain, no abdominal pain, no headaches and no shortness of breath. Nothing aggravates the symptoms. Nothing relieves the symptoms. He has tried nothing for the symptoms. The treatment provided no relief.       History reviewed. No pertinent past medical history.  There are no problems to display for this patient.   History reviewed. No pertinent surgical history.     No family history on file.  Social History   Tobacco Use  . Smoking status: Never Smoker  . Smokeless tobacco: Never Used  Substance Use Topics  . Alcohol use: Yes    Comment: socially  . Drug use: Never    Home Medications Prior to Admission medications   Not on File    Allergies    Patient has no allergy information on record.  Review of Systems   Review of Systems  Constitutional: Positive for fever. Negative for chills.  HENT: Negative for congestion and facial swelling.   Eyes: Negative for discharge and visual disturbance.  Respiratory: Negative for shortness of breath.   Cardiovascular: Negative for chest pain and palpitations.  Gastrointestinal: Negative for abdominal pain, diarrhea and vomiting.  Musculoskeletal: Positive for  back pain. Negative for arthralgias and myalgias.  Skin: Negative for color change and rash.  Neurological: Negative for tremors, syncope and headaches.  Psychiatric/Behavioral: Negative for confusion and dysphoric mood.    Physical Exam Updated Vital Signs BP 106/74 (BP Location: Left Arm)   Pulse 95   Temp 99.2 F (37.3 C) (Oral)   Resp 17   Ht 5\' 6"  (1.676 m)   Wt 77.1 kg   SpO2 98%   BMI 27.44 kg/m   Physical Exam Vitals and nursing note reviewed.  Constitutional:      Appearance: He is well-developed.  HENT:     Head: Normocephalic and atraumatic.  Eyes:     Pupils: Pupils are equal, round, and reactive to light.  Neck:     Vascular: No JVD.  Cardiovascular:     Rate and Rhythm: Normal rate and regular rhythm.     Heart sounds: No murmur heard.  No friction rub. No gallop.   Pulmonary:     Effort: No respiratory distress.     Breath sounds: No wheezing.  Abdominal:     General: There is no distension.     Tenderness: There is no guarding or rebound.  Musculoskeletal:        General: Normal range of motion.     Cervical back: Normal range of motion and neck supple.     Comments: Pulse motor and sensation intact to the right lower extremity.  Reflexes are 2+  and equal.  No clonus.  Negative Babinski bilaterally.  Ambulates without difficulty.  No obvious reproducible pain with palpation of the midline spine.  Mild paravertebral tenderness on the right along L4-5.  Skin:    Coloration: Skin is not pale.     Findings: No rash.  Neurological:     Mental Status: He is alert and oriented to person, place, and time.  Psychiatric:        Behavior: Behavior normal.     ED Results / Procedures / Treatments   Labs (all labs ordered are listed, but only abnormal results are displayed) Labs Reviewed  CBC WITH DIFFERENTIAL/PLATELET  COMPREHENSIVE METABOLIC PANEL  SEDIMENTATION RATE  C-REACTIVE PROTEIN  URINALYSIS, ROUTINE W REFLEX MICROSCOPIC     EKG None  Radiology DG Hip Unilat  With Pelvis 2-3 Views Right  Result Date: 09/16/2020 CLINICAL DATA:  RIGHT hip pain EXAM: DG HIP (WITH OR WITHOUT PELVIS) 2-3V RIGHT COMPARISON:  None FINDINGS: There is no evidence of hip fracture or dislocation. There is no evidence of arthropathy or other focal bone abnormality. Question mild subcutaneous fat stranding slightly asymmetric to the LEFT though this is un clear. IMPRESSION: 1. No acute osseous abnormality. 2. Question mild subcutaneous fat stranding, slightly asymmetric to the LEFT. Electronically Signed   By: Donzetta Kohut M.D.   On: 09/16/2020 18:00    Procedures Procedures (including critical care time)  Medications Ordered in ED Medications  acetaminophen (TYLENOL) tablet 1,000 mg (1,000 mg Oral Given 09/16/20 2024)    ED Course  I have reviewed the triage vital signs and the nursing notes.  Pertinent labs & imaging results that were available during my care of the patient were reviewed by me and considered in my medical decision making (see chart for details).    MDM Rules/Calculators/A&P                          19 yo M with a cc of fever and radicular right-sided back pain.  I think most likely the patient has something causing him to have fever and then the patient also has musculoskeletal back pain though it is hard to disprove as the patient has no other symptoms.  Offered MRI.  At the MRI is negative then I think his symptoms are most likely musculoskeletal and he also happens to have fever from some unknown source.  The patients results and plan were reviewed and discussed.   Any x-rays performed were independently reviewed by myself.   Differential diagnosis were considered with the presenting HPI.  Medications  acetaminophen (TYLENOL) tablet 1,000 mg (1,000 mg Oral Given 09/16/20 2024)    Vitals:   09/16/20 1641 09/16/20 1644 09/16/20 2028  BP: (!) 101/58  106/74  Pulse: 91  95  Resp: 15  17  Temp: 100.2  F (37.9 C)  99.2 F (37.3 C)  TempSrc: Oral  Oral  SpO2: 99%  98%  Weight:  77.1 kg   Height:  5\' 6"  (1.676 m)     Final diagnoses:  Acute radicular low back pain  Fever in adult      Final Clinical Impression(s) / ED Diagnoses Final diagnoses:  Acute radicular low back pain  Fever in adult    Rx / DC Orders ED Discharge Orders    None       , DO 09/16/20 2030

## 2021-05-15 ENCOUNTER — Ambulatory Visit: Payer: Medicaid Other | Admitting: Internal Medicine

## 2021-10-05 ENCOUNTER — Emergency Department (HOSPITAL_COMMUNITY): Payer: No Typology Code available for payment source

## 2021-10-05 ENCOUNTER — Inpatient Hospital Stay (HOSPITAL_COMMUNITY)
Admission: EM | Admit: 2021-10-05 | Discharge: 2021-10-07 | DRG: 982 | Disposition: A | Discharge status: Home / Self Care | Payer: No Typology Code available for payment source | Attending: General Surgery | Admitting: General Surgery

## 2021-10-05 DIAGNOSIS — Y907 Blood alcohol level of 200-239 mg/100 ml: Secondary | ICD-10-CM | POA: Diagnosis present

## 2021-10-05 DIAGNOSIS — S02401A Maxillary fracture, unspecified, initial encounter for closed fracture: Secondary | ICD-10-CM

## 2021-10-05 DIAGNOSIS — S0181XA Laceration without foreign body of other part of head, initial encounter: Secondary | ICD-10-CM | POA: Diagnosis present

## 2021-10-05 DIAGNOSIS — S32592A Other specified fracture of left pubis, initial encounter for closed fracture: Secondary | ICD-10-CM | POA: Diagnosis present

## 2021-10-05 DIAGNOSIS — S0240DA Maxillary fracture, left side, initial encounter for closed fracture: Principal | ICD-10-CM | POA: Diagnosis present

## 2021-10-05 DIAGNOSIS — T1490XA Injury, unspecified, initial encounter: Secondary | ICD-10-CM

## 2021-10-05 DIAGNOSIS — Z23 Encounter for immunization: Secondary | ICD-10-CM

## 2021-10-05 DIAGNOSIS — Z888 Allergy status to other drugs, medicaments and biological substances status: Secondary | ICD-10-CM

## 2021-10-05 DIAGNOSIS — S32591A Other specified fracture of right pubis, initial encounter for closed fracture: Secondary | ICD-10-CM | POA: Diagnosis present

## 2021-10-05 DIAGNOSIS — S01111A Laceration without foreign body of right eyelid and periocular area, initial encounter: Secondary | ICD-10-CM | POA: Diagnosis present

## 2021-10-05 DIAGNOSIS — S32119A Unspecified Zone I fracture of sacrum, initial encounter for closed fracture: Secondary | ICD-10-CM | POA: Diagnosis present

## 2021-10-05 DIAGNOSIS — S329XXA Fracture of unspecified parts of lumbosacral spine and pelvis, initial encounter for closed fracture: Secondary | ICD-10-CM

## 2021-10-05 DIAGNOSIS — F1010 Alcohol abuse, uncomplicated: Secondary | ICD-10-CM | POA: Diagnosis present

## 2021-10-05 DIAGNOSIS — Y92410 Unspecified street and highway as the place of occurrence of the external cause: Secondary | ICD-10-CM

## 2021-10-05 DIAGNOSIS — S022XXA Fracture of nasal bones, initial encounter for closed fracture: Secondary | ICD-10-CM | POA: Diagnosis present

## 2021-10-05 DIAGNOSIS — Z20822 Contact with and (suspected) exposure to covid-19: Secondary | ICD-10-CM | POA: Diagnosis present

## 2021-10-05 MED ORDER — TETANUS-DIPHTH-ACELL PERTUSSIS 5-2.5-18.5 LF-MCG/0.5 IM SUSY
0.5000 mL | PREFILLED_SYRINGE | Freq: Once | INTRAMUSCULAR | Status: AC
  Administered 2021-10-06: 0.5 mL via INTRAMUSCULAR
  Filled 2021-10-05: qty 0.5

## 2021-10-05 MED ORDER — FENTANYL CITRATE PF 50 MCG/ML IJ SOSY
100.0000 ug | PREFILLED_SYRINGE | Freq: Once | INTRAMUSCULAR | Status: AC
  Administered 2021-10-05: 100 ug via INTRAVENOUS

## 2021-10-06 ENCOUNTER — Inpatient Hospital Stay (HOSPITAL_COMMUNITY): Payer: No Typology Code available for payment source

## 2021-10-06 ENCOUNTER — Encounter (HOSPITAL_COMMUNITY): Payer: Self-pay

## 2021-10-06 DIAGNOSIS — Y907 Blood alcohol level of 200-239 mg/100 ml: Secondary | ICD-10-CM | POA: Diagnosis present

## 2021-10-06 DIAGNOSIS — S32119A Unspecified Zone I fracture of sacrum, initial encounter for closed fracture: Secondary | ICD-10-CM | POA: Diagnosis present

## 2021-10-06 DIAGNOSIS — Z888 Allergy status to other drugs, medicaments and biological substances status: Secondary | ICD-10-CM | POA: Diagnosis not present

## 2021-10-06 DIAGNOSIS — S022XXA Fracture of nasal bones, initial encounter for closed fracture: Secondary | ICD-10-CM | POA: Diagnosis present

## 2021-10-06 DIAGNOSIS — F1010 Alcohol abuse, uncomplicated: Secondary | ICD-10-CM | POA: Diagnosis present

## 2021-10-06 DIAGNOSIS — S0240DA Maxillary fracture, left side, initial encounter for closed fracture: Secondary | ICD-10-CM | POA: Diagnosis present

## 2021-10-06 DIAGNOSIS — Y92410 Unspecified street and highway as the place of occurrence of the external cause: Secondary | ICD-10-CM | POA: Diagnosis not present

## 2021-10-06 DIAGNOSIS — Z23 Encounter for immunization: Secondary | ICD-10-CM | POA: Diagnosis not present

## 2021-10-06 DIAGNOSIS — S0181XA Laceration without foreign body of other part of head, initial encounter: Secondary | ICD-10-CM | POA: Diagnosis present

## 2021-10-06 DIAGNOSIS — S02401A Maxillary fracture, unspecified, initial encounter for closed fracture: Secondary | ICD-10-CM | POA: Diagnosis present

## 2021-10-06 DIAGNOSIS — S32592A Other specified fracture of left pubis, initial encounter for closed fracture: Secondary | ICD-10-CM | POA: Diagnosis present

## 2021-10-06 DIAGNOSIS — S32591A Other specified fracture of right pubis, initial encounter for closed fracture: Secondary | ICD-10-CM | POA: Diagnosis present

## 2021-10-06 DIAGNOSIS — Z20822 Contact with and (suspected) exposure to covid-19: Secondary | ICD-10-CM | POA: Diagnosis present

## 2021-10-06 DIAGNOSIS — S329XXA Fracture of unspecified parts of lumbosacral spine and pelvis, initial encounter for closed fracture: Secondary | ICD-10-CM

## 2021-10-06 DIAGNOSIS — S01111A Laceration without foreign body of right eyelid and periocular area, initial encounter: Secondary | ICD-10-CM | POA: Diagnosis present

## 2021-10-06 LAB — BASIC METABOLIC PANEL
Anion gap: 12 (ref 5–15)
BUN: 12 mg/dL (ref 6–20)
CO2: 18 mmol/L — ABNORMAL LOW (ref 22–32)
Calcium: 8.2 mg/dL — ABNORMAL LOW (ref 8.9–10.3)
Chloride: 110 mmol/L (ref 98–111)
Creatinine, Ser: 0.89 mg/dL (ref 0.61–1.24)
GFR, Estimated: >60 mL/min (ref >60)
Glucose, Bld: 167 mg/dL — ABNORMAL HIGH (ref 70–99)
Potassium: 3.6 mmol/L (ref 3.5–5.1)
Sodium: 140 mmol/L (ref 135–145)

## 2021-10-06 LAB — CBC
HCT: 41 % (ref 39.0–52.0)
HCT: 46.4 % (ref 39.0–52.0)
Hemoglobin: 13.6 g/dL (ref 13.0–17.0)
Hemoglobin: 15.2 g/dL (ref 13.0–17.0)
MCH: 28.9 pg (ref 26.0–34.0)
MCH: 29.1 pg (ref 26.0–34.0)
MCHC: 32.8 g/dL (ref 30.0–36.0)
MCHC: 33.2 g/dL (ref 30.0–36.0)
MCV: 87.8 fL (ref 80.0–100.0)
MCV: 88.2 fL (ref 80.0–100.0)
Platelets: 199 10*3/uL (ref 150–400)
Platelets: 236 10*3/uL (ref 150–400)
RBC: 4.67 MIL/uL (ref 4.22–5.81)
RBC: 5.26 MIL/uL (ref 4.22–5.81)
RDW: 12.7 % (ref 11.5–15.5)
RDW: 12.9 % (ref 11.5–15.5)
WBC: 12.2 10*3/uL — ABNORMAL HIGH (ref 4.0–10.5)
WBC: 17.9 10*3/uL — ABNORMAL HIGH (ref 4.0–10.5)
nRBC: 0 % (ref 0.0–0.2)
nRBC: 0 % (ref 0.0–0.2)

## 2021-10-06 LAB — I-STAT CHEM 8, ED
BUN: 14 mg/dL (ref 6–20)
Calcium, Ion: 1.05 mmol/L — ABNORMAL LOW (ref 1.15–1.40)
Chloride: 108 mmol/L (ref 98–111)
Creatinine, Ser: 1.5 mg/dL — ABNORMAL HIGH (ref 0.61–1.24)
Glucose, Bld: 127 mg/dL — ABNORMAL HIGH (ref 70–99)
HCT: 47 % (ref 39.0–52.0)
Hemoglobin: 16 g/dL (ref 13.0–17.0)
Potassium: 3.4 mmol/L — ABNORMAL LOW (ref 3.5–5.1)
Sodium: 143 mmol/L (ref 135–145)
TCO2: 24 mmol/L (ref 22–32)

## 2021-10-06 LAB — COMPREHENSIVE METABOLIC PANEL
ALT: 211 U/L — ABNORMAL HIGH (ref 0–44)
AST: 342 U/L — ABNORMAL HIGH (ref 15–41)
Albumin: 4.3 g/dL (ref 3.5–5.0)
Alkaline Phosphatase: 102 U/L (ref 38–126)
Anion gap: 13 (ref 5–15)
BUN: 12 mg/dL (ref 6–20)
CO2: 21 mmol/L — ABNORMAL LOW (ref 22–32)
Calcium: 8.8 mg/dL — ABNORMAL LOW (ref 8.9–10.3)
Chloride: 107 mmol/L (ref 98–111)
Creatinine, Ser: 1.13 mg/dL (ref 0.61–1.24)
GFR, Estimated: >60 mL/min (ref >60)
Glucose, Bld: 143 mg/dL — ABNORMAL HIGH (ref 70–99)
Potassium: 3.1 mmol/L — ABNORMAL LOW (ref 3.5–5.1)
Sodium: 141 mmol/L (ref 135–145)
Total Bilirubin: 0.5 mg/dL (ref 0.3–1.2)
Total Protein: 7.4 g/dL (ref 6.5–8.1)

## 2021-10-06 LAB — PROTIME-INR
INR: 1.1 (ref 0.8–1.2)
Prothrombin Time: 14 seconds (ref 11.4–15.2)

## 2021-10-06 LAB — RESP PANEL BY RT-PCR (FLU A&B, COVID) ARPGX2
Influenza A by PCR: NEGATIVE
Influenza B by PCR: NEGATIVE
SARS Coronavirus 2 by RT PCR: NEGATIVE

## 2021-10-06 LAB — SAMPLE TO BLOOD BANK

## 2021-10-06 LAB — RAPID HIV SCREEN (HIV 1/2 AB+AG)
HIV 1/2 Antibodies: NONREACTIVE
HIV-1 P24 Antigen - HIV24: NONREACTIVE

## 2021-10-06 LAB — ETHANOL: Alcohol, Ethyl (B): 213 mg/dL — ABNORMAL HIGH (ref <10)

## 2021-10-06 LAB — LACTIC ACID, PLASMA
Lactic Acid, Venous: 2.2 mmol/L (ref 0.5–1.9)
Lactic Acid, Venous: 3.4 mmol/L (ref 0.5–1.9)

## 2021-10-06 LAB — MRSA NEXT GEN BY PCR, NASAL: MRSA by PCR Next Gen: NOT DETECTED

## 2021-10-06 LAB — HEPATITIS B SURFACE ANTIGEN: Hepatitis B Surface Ag: NONREACTIVE

## 2021-10-06 MED ORDER — ACETAMINOPHEN 325 MG PO TABS
650.0000 mg | ORAL_TABLET | ORAL | Status: DC | PRN
  Administered 2021-10-06: 650 mg via ORAL
  Filled 2021-10-06: qty 2

## 2021-10-06 MED ORDER — SODIUM CHLORIDE 0.9 % IV SOLN: INTRAVENOUS | Status: DC

## 2021-10-06 MED ORDER — FENTANYL CITRATE PF 50 MCG/ML IJ SOSY
50.0000 ug | PREFILLED_SYRINGE | Freq: Once | INTRAMUSCULAR | Status: DC
  Filled 2021-10-06: qty 1

## 2021-10-06 MED ORDER — ENOXAPARIN SODIUM 30 MG/0.3ML IJ SOSY: 30.0000 mg | PREFILLED_SYRINGE | Freq: Two times a day (BID) | INTRAMUSCULAR | Status: DC

## 2021-10-06 MED ORDER — METOPROLOL TARTRATE 5 MG/5ML IV SOLN: 5.0000 mg | Freq: Four times a day (QID) | INTRAVENOUS | Status: DC | PRN

## 2021-10-06 MED ORDER — HYDROMORPHONE HCL 1 MG/ML IJ SOLN
INTRAMUSCULAR | Status: AC
  Administered 2021-10-06: 1 mg
  Filled 2021-10-06: qty 1

## 2021-10-06 MED ORDER — OXYCODONE HCL 5 MG PO TABS
5.0000 mg | ORAL_TABLET | ORAL | Status: DC | PRN
  Administered 2021-10-06 – 2021-10-07 (×3): 5 mg via ORAL
  Filled 2021-10-06 (×3): qty 1

## 2021-10-06 MED ORDER — ONDANSETRON 4 MG PO TBDP: 4.0000 mg | ORAL_TABLET | Freq: Four times a day (QID) | ORAL | Status: DC | PRN

## 2021-10-06 MED ORDER — IOHEXOL 300 MG/ML  SOLN
100.0000 mL | Freq: Once | INTRAMUSCULAR | Status: AC | PRN
  Administered 2021-10-06: 100 mL via INTRAVENOUS

## 2021-10-06 MED ORDER — MORPHINE SULFATE (PF) 2 MG/ML IV SOLN
2.0000 mg | INTRAVENOUS | Status: DC | PRN
  Administered 2021-10-06 (×2): 4 mg via INTRAVENOUS
  Filled 2021-10-06 (×2): qty 2

## 2021-10-06 MED ORDER — DOCUSATE SODIUM 100 MG PO CAPS
100.0000 mg | ORAL_CAPSULE | Freq: Two times a day (BID) | ORAL | Status: DC
  Administered 2021-10-06 – 2021-10-07 (×3): 100 mg via ORAL
  Filled 2021-10-06 (×3): qty 1

## 2021-10-06 MED ORDER — ONDANSETRON HCL 4 MG/2ML IJ SOLN
4.0000 mg | Freq: Four times a day (QID) | INTRAMUSCULAR | Status: DC | PRN
  Administered 2021-10-06: 4 mg via INTRAVENOUS
  Filled 2021-10-06: qty 2
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
## 2021-10-06 NOTE — Discharge Instructions (Addendum)
     Discharge Instructions    Attending Surgeon: Huel Cote, MD Office Phone Number: [phone]   Diagnosis:    Surgeries Performed: Right lateral compression pelvic ring injury  Discharge Plan:    Activity:  Weight bearing as tolerated, utilizing crutches.    You will need to follow up with dentistry regarding dental injuries. Recommend soft diet to minimize pain with chewing.
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
## 2021-10-06 NOTE — ED Notes (Signed)
In c-t with rn sophie  she speaks spanish
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
## 2021-10-06 NOTE — Plan of Care (Signed)
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
## 2021-10-06 NOTE — ED Notes (Signed)
Report given to dana rn on 4n

## 2021-10-07 ENCOUNTER — Other Ambulatory Visit (HOSPITAL_COMMUNITY): Payer: Self-pay

## 2021-10-07 ENCOUNTER — Other Ambulatory Visit: Payer: Self-pay

## 2021-10-07 ENCOUNTER — Encounter (HOSPITAL_COMMUNITY): Payer: Self-pay

## 2021-10-07 LAB — CBC
HCT: 34 % — ABNORMAL LOW (ref 39.0–52.0)
Hemoglobin: 10.9 g/dL — ABNORMAL LOW (ref 13.0–17.0)
MCH: 28.5 pg (ref 26.0–34.0)
MCHC: 32.1 g/dL (ref 30.0–36.0)
MCV: 88.8 fL (ref 80.0–100.0)
Platelets: 128 10*3/uL — ABNORMAL LOW (ref 150–400)
RBC: 3.83 MIL/uL — ABNORMAL LOW (ref 4.22–5.81)
RDW: 13 % (ref 11.5–15.5)
WBC: 10.1 10*3/uL (ref 4.0–10.5)
nRBC: 0 % (ref 0.0–0.2)

## 2021-10-07 MED ORDER — CEPHALEXIN 250 MG PO CAPS
250.0000 mg | ORAL_CAPSULE | Freq: Four times a day (QID) | ORAL | 0 refills | Status: AC
  Filled 2021-10-07: qty 20, 5d supply, fill #0

## 2021-10-07 MED ORDER — SODIUM CHLORIDE 0.9% FLUSH: 3.0000 mL | INTRAVENOUS | Status: DC | PRN

## 2021-10-07 MED ORDER — METHOCARBAMOL 500 MG PO TABS: 500.0000 mg | ORAL_TABLET | Freq: Three times a day (TID) | ORAL | Status: DC | PRN

## 2021-10-07 MED ORDER — OXYCODONE HCL 5 MG PO TABS
5.0000 mg | ORAL_TABLET | ORAL | Status: DC | PRN
Start: 2021-10-07 — End: 2021-10-07

## 2021-10-07 MED ORDER — ASPIRIN 325 MG PO TBEC
325.0000 mg | DELAYED_RELEASE_TABLET | Freq: Every day | ORAL | 0 refills | Status: AC
  Filled 2021-10-07: qty 30, 30d supply, fill #0

## 2021-10-07 MED ORDER — SODIUM CHLORIDE 0.9 % IV SOLN: 250.0000 mL | INTRAVENOUS | Status: DC | PRN

## 2021-10-07 MED ORDER — ACETAMINOPHEN 325 MG PO TABS: 650.0000 mg | ORAL_TABLET | Freq: Four times a day (QID) | ORAL | Status: AC | PRN

## 2021-10-07 MED ORDER — ACETAMINOPHEN 325 MG PO TABS
650.0000 mg | ORAL_TABLET | Freq: Four times a day (QID) | ORAL | Status: DC
  Administered 2021-10-07: 650 mg via ORAL
  Filled 2021-10-07: qty 2

## 2021-10-07 MED ORDER — METHOCARBAMOL 500 MG PO TABS
500.0000 mg | ORAL_TABLET | Freq: Three times a day (TID) | ORAL | 0 refills | Status: AC | PRN
  Filled 2021-10-07: qty 20, 7d supply, fill #0

## 2021-10-07 MED ORDER — SODIUM CHLORIDE 0.9% FLUSH
3.0000 mL | Freq: Two times a day (BID) | INTRAVENOUS | Status: DC
  Administered 2021-10-07: 3 mL via INTRAVENOUS

## 2021-10-07 MED ORDER — CHLORHEXIDINE GLUCONATE 0.12 % MT SOLN
15.0000 mL | Freq: Four times a day (QID) | OROMUCOSAL | 0 refills | Status: AC
  Filled 2021-10-07: qty 473, 8d supply, fill #0

## 2021-10-07 MED ORDER — OXYCODONE HCL 5 MG PO TABS
5.0000 mg | ORAL_TABLET | Freq: Four times a day (QID) | ORAL | 0 refills | Status: AC | PRN
Start: 2021-10-07 — End: ?
  Filled 2021-10-07: qty 30, 4d supply, fill #0

## 2021-10-07 NOTE — Progress Notes (Signed)
Patient discharged to home via wheelchair with mother. Extra supplies given to patient at discharge. Medications delivered to room and taken with patient and mother at discharge also.

## 2021-10-07 NOTE — Progress Notes (Signed)
Discharge instructions given to pt via interpreter. Pt verbalized understanding of all teaching and had no additional questions. Pt currently waiting in room for mom to come pick him up. Discharge paperwork given in spanish to take home.
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
